# Patient Record
Sex: Male | Born: 1995 | Race: Black or African American | Hispanic: No | Marital: Single | Smoking: Never smoker
Health system: Southern US, Community
[De-identification: ages and names within clinical notes are randomized; demographics above are authoritative.]

---
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
## 2019-05-27 ENCOUNTER — Encounter: Payer: Self-pay | Admitting: Emergency Medicine

## 2019-05-27 ENCOUNTER — Emergency Department: Payer: No Typology Code available for payment source

## 2019-05-27 ENCOUNTER — Other Ambulatory Visit: Payer: Self-pay

## 2019-05-27 ENCOUNTER — Emergency Department
Admission: EM | Admit: 2019-05-27 | Discharge: 2019-05-27 | Disposition: A | Discharge status: Home / Self Care | Payer: No Typology Code available for payment source | Attending: Emergency Medicine | Admitting: Emergency Medicine

## 2019-05-27 DIAGNOSIS — M545 Low back pain: Secondary | ICD-10-CM | POA: Diagnosis present

## 2019-05-27 DIAGNOSIS — M791 Myalgia, unspecified site: Secondary | ICD-10-CM | POA: Insufficient documentation

## 2019-05-27 DIAGNOSIS — M7918 Myalgia, other site: Secondary | ICD-10-CM

## 2019-05-27 LAB — COMPREHENSIVE METABOLIC PANEL
ALT: 9 U/L (ref 0–44)
AST: 17 U/L (ref 15–41)
Albumin: 4.7 g/dL (ref 3.5–5.0)
Alkaline Phosphatase: 85 U/L (ref 38–126)
Anion gap: 10 (ref 5–15)
BUN: 15 mg/dL (ref 6–20)
CO2: 27 mmol/L (ref 22–32)
Calcium: 9.2 mg/dL (ref 8.9–10.3)
Chloride: 102 mmol/L (ref 98–111)
Creatinine, Ser: 0.85 mg/dL (ref 0.61–1.24)
GFR calc Af Amer: >60 mL/min (ref >60)
GFR calc non Af Amer: >60 mL/min (ref >60)
Glucose, Bld: 107 mg/dL — ABNORMAL HIGH (ref 70–99)
Potassium: 3.4 mmol/L — ABNORMAL LOW (ref 3.5–5.1)
Sodium: 139 mmol/L (ref 135–145)
Total Bilirubin: 1.6 mg/dL — ABNORMAL HIGH (ref 0.3–1.2)
Total Protein: 7.6 g/dL (ref 6.5–8.1)

## 2019-05-27 LAB — CBC
HCT: 44.1 % (ref 39.0–52.0)
Hemoglobin: 14.1 g/dL (ref 13.0–17.0)
MCH: 27.4 pg (ref 26.0–34.0)
MCHC: 32 g/dL (ref 30.0–36.0)
MCV: 85.8 fL (ref 80.0–100.0)
Platelets: 165 10*3/uL (ref 150–400)
RBC: 5.14 MIL/uL (ref 4.22–5.81)
RDW: 12.2 % (ref 11.5–15.5)
WBC: 5 10*3/uL (ref 4.0–10.5)
nRBC: 0 % (ref 0.0–0.2)

## 2019-05-27 LAB — ETHANOL: Alcohol, Ethyl (B): <10 mg/dL (ref <10)
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
## 2019-05-27 MED ORDER — IOHEXOL 300 MG/ML  SOLN
100.0000 mL | Freq: Once | INTRAMUSCULAR | Status: AC | PRN
  Administered 2019-05-27: 100 mL via INTRAVENOUS

## 2019-05-27 NOTE — ED Notes (Signed)
Patient returned from CT
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
## 2019-05-27 NOTE — ED Notes (Signed)
Reviewed discharge instructions, follow-up care, cryotherapy, and elevation with patient. Patient verbalized understanding of all information reviewed. Patient stable, with no distress noted at this time.    

## 2019-05-27 NOTE — ED Notes (Signed)
RN to bedside for rounding. Patient in CT 
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
